# Patient Record
Sex: Female | Born: 1990 | Race: Black or African American | Hispanic: No | Marital: Single | Smoking: Current every day smoker
Health system: Southern US, Community
[De-identification: ages and names within clinical notes are randomized; demographics above are authoritative.]

## PROBLEM LIST (undated history)

## (undated) DIAGNOSIS — K219 Gastro-esophageal reflux disease without esophagitis: Secondary | ICD-10-CM

---

## 2013-05-12 ENCOUNTER — Emergency Department (HOSPITAL_COMMUNITY)
Admission: EM | Admit: 2013-05-12 | Discharge: 2013-05-12 | Disposition: A | Discharge status: Home / Self Care | Payer: PRIVATE HEALTH INSURANCE | Source: Home / Self Care | Attending: Family Medicine | Admitting: Family Medicine

## 2013-05-12 ENCOUNTER — Encounter (HOSPITAL_COMMUNITY): Payer: Self-pay | Admitting: Emergency Medicine

## 2013-05-12 DIAGNOSIS — J40 Bronchitis, not specified as acute or chronic: Secondary | ICD-10-CM

## 2013-05-12 MED ORDER — ALBUTEROL SULFATE HFA 108 (90 BASE) MCG/ACT IN AERS: 2.0000 | INHALATION_SPRAY | Freq: Four times a day (QID) | RESPIRATORY_TRACT | Status: AC | PRN

## 2013-05-12 MED ORDER — GUAIFENESIN-CODEINE 100-10 MG/5ML PO SOLN: 5.0000 mL | Freq: Every evening | ORAL | Status: DC | PRN

## 2013-05-12 MED ORDER — IPRATROPIUM-ALBUTEROL 0.5-2.5 (3) MG/3ML IN SOLN
3.0000 mL | Freq: Once | RESPIRATORY_TRACT | Status: AC
  Administered 2013-05-12: 3 mL via RESPIRATORY_TRACT

## 2013-05-12 MED ORDER — PREDNISONE 10 MG PO TABS: 30.0000 mg | ORAL_TABLET | Freq: Every day | ORAL | Status: DC

## 2013-05-12 MED ORDER — IPRATROPIUM BROMIDE 0.06 % NA SOLN: 2.0000 | Freq: Four times a day (QID) | NASAL | Status: DC

## 2013-05-12 MED ORDER — IPRATROPIUM-ALBUTEROL 0.5-2.5 (3) MG/3ML IN SOLN
RESPIRATORY_TRACT | Status: AC
  Filled 2013-05-12: qty 3

## 2013-05-12 NOTE — ED Provider Notes (Signed)
Susan Wells is a 23 y.o. female who presents to Urgent Care today for 3 days of cough fever chills headaches and ear congestion. Patient also notes occasional body aches. She's tried over-the-counter cough and cold medications which have helped. She notes some wheezing and mild shortness of breath. She denies any nausea vomiting or diarrhea. She plays drums in the marching band at [employer]    History reviewed. No pertinent past medical history. History  Substance Use Topics  . Smoking status: Current Every Day Smoker -- 0.50 packs/day    Types: Cigarettes  . Smokeless tobacco: Not on file  . Alcohol Use: Yes   ROS as above Medications: No current facility-administered medications for this encounter.   Current Outpatient Prescriptions  Medication Sig Dispense Refill  . albuterol (PROVENTIL HFA;VENTOLIN HFA) 108 (90 BASE) MCG/ACT inhaler Inhale 2 puffs into the lungs every 6 (six) hours as needed for wheezing or shortness of breath.  1 Inhaler  2  . guaiFENesin-codeine 100-10 MG/5ML syrup Take 5 mLs by mouth at bedtime as needed for cough.  120 mL  0  . ipratropium (ATROVENT) 0.06 % nasal spray Place 2 sprays into both nostrils 4 (four) times daily.  15 mL  1  . predniSONE (DELTASONE) 10 MG tablet Take 3 tablets (30 mg total) by mouth daily.  15 tablet  0    Exam:  BP 155/78  Pulse 85  Temp(Src) 98.8 F (37.1 C) (Oral)  Resp 17  SpO2 98%  LMP 04/30/2013 Gen: Well NAD HEENT: EOMI,  MMM normal-appearing posterior pharynx. Tympanic membranes are normal appearing bilaterally Lungs: Normal work of breathing. Coarse breath sounds throughout Heart: RRR no MRG Abd: NABS, Soft. NT, ND Exts: Brisk capillary refill, warm and well perfused.   Patient was given a DuoNeb nebulizer treatment, and had significant improvement in symptoms   Assessment and Plan: 23 y.o. female with bronchitis. Plan treat with prednisone albuterol codeine containing cough medication and Atrovent  nasal spray. School note provided.  Discussed warning signs or symptoms. Please see discharge instructions. Patient expresses understanding.    Rodolph BongEvan S Hadleigh Felber, MD 05/12/13 [id number]

## 2013-05-12 NOTE — Discharge Instructions (Signed)
Thank you for coming in today. Take prednisone daily for 5 days. Use codeine containing cough medication at bedtime as needed. Use Atrovent nasal spray for runny nose. Use albuterol inhaler as needed. Call or go to the emergency room if you get worse, have trouble breathing, have chest pains, or palpitations.    Bronchitis Bronchitis is inflammation of the airways that extend from the windpipe into the lungs (bronchi). The inflammation often causes mucus to develop, which leads to a cough. If the inflammation becomes severe, it may cause shortness of breath. CAUSES  Bronchitis may be caused by:   Viral infections.   Bacteria.   Cigarette smoke.   Allergens, pollutants, and other irritants.  SIGNS AND SYMPTOMS  The most common symptom of bronchitis is a frequent cough that produces mucus. Other symptoms include:  Fever.   Body aches.   Chest congestion.   Chills.   Shortness of breath.   Sore throat.  DIAGNOSIS  Bronchitis is usually diagnosed through a medical history and physical exam. Tests, such as chest X-rays, are sometimes done to rule out other conditions.  TREATMENT  You may need to avoid contact with whatever caused the problem (smoking, for example). Medicines are sometimes needed. These may include:  Antibiotics. These may be prescribed if the condition is caused by bacteria.  Cough suppressants. These may be prescribed for relief of cough symptoms.   Inhaled medicines. These may be prescribed to help open your airways and make it easier for you to breathe.   Steroid medicines. These may be prescribed for those with recurrent (chronic) bronchitis. HOME CARE INSTRUCTIONS  Get plenty of rest.   Drink enough fluids to keep your urine clear or pale yellow (unless you have a medical condition that requires fluid restriction). Increasing fluids may help thin your secretions and will prevent dehydration.   Only take over-the-counter or  prescription medicines as directed by your health care provider.  Only take antibiotics as directed. Make sure you finish them even if you start to feel better.  Avoid secondhand smoke, irritating chemicals, and strong fumes. These will make bronchitis worse. If you are a smoker, quit smoking. Consider using nicotine gum or skin patches to help control withdrawal symptoms. Quitting smoking will help your lungs heal faster.   Put a cool-mist humidifier in your bedroom at night to moisten the air. This may help loosen mucus. Change the water in the humidifier daily. You can also run the hot water in your shower and sit in the bathroom with the door closed for 5 10 minutes.   Follow up with your health care provider as directed.   Wash your hands frequently to avoid catching bronchitis again or spreading an infection to others.  SEEK MEDICAL CARE IF: Your symptoms do not improve after 1 week of treatment.  SEEK IMMEDIATE MEDICAL CARE IF:  Your fever increases.  You have chills.   You have chest pain.   You have worsening shortness of breath.   You have bloody sputum.  You faint.  You have lightheadedness.  You have a severe headache.   You vomit repeatedly. MAKE SURE YOU:   Understand these instructions.  Will watch your condition.  Will get help right away if you are not doing well or get worse. Document Released: 04/08/2005 Document Revised: 01/27/2013 Document Reviewed: 12/01/2012 [facility] Patient Information 2014 [place], [place].

## 2013-05-12 NOTE — ED Notes (Signed)
Report she feels "a little bit better" after breathing tx.

## 2013-05-12 NOTE — ED Notes (Signed)
Pt c/o cold sxs onset Monday w/sxs that include: cough, wheezing, fevers, BA Denies: v/d... Taking dayquil/nyquil, hot tea w/no signs of acute distress Alert w/no signs of acute distress.

## 2013-05-28 ENCOUNTER — Emergency Department (HOSPITAL_COMMUNITY)
Admission: EM | Admit: 2013-05-28 | Discharge: 2013-05-28 | Disposition: A | Discharge status: Home / Self Care | Payer: No Typology Code available for payment source | Attending: Emergency Medicine | Admitting: Emergency Medicine

## 2013-05-28 DIAGNOSIS — F17200 Nicotine dependence, unspecified, uncomplicated: Secondary | ICD-10-CM | POA: Insufficient documentation

## 2013-05-28 DIAGNOSIS — IMO0002 Reserved for concepts with insufficient information to code with codable children: Secondary | ICD-10-CM | POA: Insufficient documentation

## 2013-05-28 DIAGNOSIS — S39012A Strain of muscle, fascia and tendon of lower back, initial encounter: Secondary | ICD-10-CM

## 2013-05-28 DIAGNOSIS — S0993XA Unspecified injury of face, initial encounter: Secondary | ICD-10-CM | POA: Insufficient documentation

## 2013-05-28 DIAGNOSIS — Z79899 Other long term (current) drug therapy: Secondary | ICD-10-CM | POA: Insufficient documentation

## 2013-05-28 DIAGNOSIS — S335XXA Sprain of ligaments of lumbar spine, initial encounter: Secondary | ICD-10-CM | POA: Insufficient documentation

## 2013-05-28 DIAGNOSIS — S199XXA Unspecified injury of neck, initial encounter: Secondary | ICD-10-CM

## 2013-05-28 DIAGNOSIS — Y9389 Activity, other specified: Secondary | ICD-10-CM | POA: Insufficient documentation

## 2013-05-28 DIAGNOSIS — Y92410 Unspecified street and highway as the place of occurrence of the external cause: Secondary | ICD-10-CM | POA: Insufficient documentation

## 2013-05-28 DIAGNOSIS — S0990XA Unspecified injury of head, initial encounter: Secondary | ICD-10-CM | POA: Insufficient documentation
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
## 2013-05-28 NOTE — ED Provider Notes (Signed)
Medical screening examination/treatment/procedure(s) were performed by non-physician practitioner and as supervising physician I was immediately available for consultation/collaboration.  EKG Interpretation   None         Kristen N Ward, DO 05/28/13 1454 
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
## 2014-06-30 ENCOUNTER — Encounter (HOSPITAL_COMMUNITY): Payer: Self-pay | Admitting: Emergency Medicine

## 2014-06-30 ENCOUNTER — Emergency Department (INDEPENDENT_AMBULATORY_CARE_PROVIDER_SITE_OTHER)
Admission: EM | Admit: 2014-06-30 | Discharge: 2014-06-30 | Disposition: A | Discharge status: Home / Self Care | Payer: Self-pay | Source: Home / Self Care | Attending: Family Medicine | Admitting: Family Medicine

## 2014-06-30 DIAGNOSIS — J069 Acute upper respiratory infection, unspecified: Secondary | ICD-10-CM

## 2014-06-30 DIAGNOSIS — B9789 Other viral agents as the cause of diseases classified elsewhere: Secondary | ICD-10-CM

## 2014-06-30 DIAGNOSIS — J040 Acute laryngitis: Secondary | ICD-10-CM

## 2014-06-30 MED ORDER — PREDNISONE 10 MG PO TABS: 30.0000 mg | ORAL_TABLET | Freq: Every day | ORAL | Status: AC

## 2014-06-30 MED ORDER — IPRATROPIUM BROMIDE 0.06 % NA SOLN: 2.0000 | Freq: Four times a day (QID) | NASAL | Status: AC

## 2014-06-30 MED ORDER — TRAMADOL HCL 50 MG PO TABS: 50.0000 mg | ORAL_TABLET | Freq: Every evening | ORAL | Status: AC | PRN

## 2014-06-30 NOTE — ED Notes (Signed)
Pt states that she has had a cough and congestion since Tuesday evening.

## 2014-06-30 NOTE — Discharge Instructions (Signed)
Thank you for coming in today. Call or go to the emergency room if you get worse, have trouble breathing, have chest pains, or palpitations.  Do not drive after taking tramadol.     Cough, Adult  A cough is a reflex that helps clear your throat and airways. It can help heal the body or may be a reaction to an irritated airway. A cough may only last 2 or 3 weeks (acute) or may last more than 8 weeks (chronic).  CAUSES Acute cough:  Viral or bacterial infections. Chronic cough:  Infections.  Allergies.  Asthma.  Post-nasal drip.  Smoking.  Heartburn or acid reflux.  Some medicines.  Chronic lung problems (COPD).  Cancer. SYMPTOMS   Cough.  Fever.  Chest pain.  Increased breathing rate.  High-pitched whistling sound when breathing (wheezing).  Colored mucus that you cough up (sputum). TREATMENT   A bacterial cough may be treated with antibiotic medicine.  A viral cough must run its course and will not respond to antibiotics.  Your caregiver may recommend other treatments if you have a chronic cough. HOME CARE INSTRUCTIONS   Only take over-the-counter or prescription medicines for pain, discomfort, or fever as directed by your caregiver. Use cough suppressants only as directed by your caregiver.  Use a cold steam vaporizer or humidifier in your bedroom or home to help loosen secretions.  Sleep in a semi-upright position if your cough is worse at night.  Rest as needed.  Stop smoking if you smoke. SEEK IMMEDIATE MEDICAL CARE IF:   You have pus in your sputum.  Your cough starts to worsen.  You cannot control your cough with suppressants and are losing sleep.  You begin coughing up blood.  You have difficulty breathing.  You develop pain which is getting worse or is uncontrolled with medicine.  You have a fever. MAKE SURE YOU:   Understand these instructions.  Will watch your condition.  Will get help right away if you are not doing well or  get worse. Document Released: 10/05/2010 Document Revised: 07/01/2011 Document Reviewed: 10/05/2010 [facility] Patient Information 2015 [place], [place]. This information is not intended to replace advice given to you by your health care provider. Make sure you discuss any questions you have with your health care provider.  Laryngitis At the top of your windpipe is your voice box. It is the source of your voice. Inside your voice box are 2 bands of muscles called vocal cords. When you breathe, your vocal cords are relaxed and open so that air can get into the lungs. When you decide to say something, these cords come together and vibrate. The sound from these vibrations goes into your throat and comes out through your mouth as sound. Laryngitis is an inflammation of the vocal cords that causes hoarseness, cough, loss of voice, sore throat, and dry throat. Laryngitis can be temporary (acute) or long-term (chronic). Most cases of acute laryngitis improve with time.Chronic laryngitis lasts for more than 3 weeks. CAUSES Laryngitis can often be related to excessive smoking, talking, or yelling, as well as inhalation of toxic fumes and allergies. Acute laryngitis is usually caused by a viral infection, vocal strain, measles or mumps, or bacterial infections. Chronic laryngitis is usually caused by vocal cord strain, vocal cord injury, postnasal drip, growths on the vocal cords, or acid reflux. SYMPTOMS   Cough.  Sore throat.  Dry throat. RISK FACTORS  Respiratory infections.  Exposure to irritating substances, such as cigarette smoke, excessive amounts of alcohol,  stomach acids, and workplace chemicals.  Voice trauma, such as vocal cord injury from shouting or speaking too loud. DIAGNOSIS  Your cargiver will perform a physical exam. During the physical exam, your caregiver will examine your throat. The most common sign of laryngitis is hoarseness. Laryngoscopy may be necessary to confirm the diagnosis  of this condition. This procedure allows your caregiver to look into the larynx. HOME CARE INSTRUCTIONS  Drink enough fluids to keep your urine clear or pale yellow.  Rest until you no longer have symptoms or as directed by your caregiver.  Breathe in moist air.  Take all medicine as directed by your caregiver.  Do not smoke.  Talk as little as possible (this includes whispering).  Write on paper instead of talking until your voice is back to normal.  Follow up with your caregiver if your condition has not improved after 10 days. SEEK MEDICAL CARE IF:   You have trouble breathing.  You cough up blood.  You have persistent fever.  You have increasing pain.  You have difficulty swallowing. MAKE SURE YOU:  Understand these instructions.  Will watch your condition.  Will get help right away if you are not doing well or get worse. Document Released: 04/08/2005 Document Revised: 07/01/2011 Document Reviewed: 06/14/2010 [facility] Patient Information 2015 [place], [place]. This information is not intended to replace advice given to you by your health care provider. Make sure you discuss any questions you have with your health care provider.

## 2014-06-30 NOTE — ED Provider Notes (Signed)
Susan Wells is a 24 y.o. female who presents to Urgent Care today for cough congestion and hoarse voice headache chills body aches present for 2 days. One episode of vomiting. No diarrhea chest pain palpitations or abdominal pain. Patient has tried some over-the-counter medications which help. She feels well otherwise.   History reviewed. No pertinent past medical history. History reviewed. No pertinent past surgical history. History  Substance Use Topics  . Smoking status: Current Every Day Smoker -- 0.50 packs/day    Types: Cigarettes  . Smokeless tobacco: Not on file  . Alcohol Use: Yes   ROS as above Medications: No current facility-administered medications for this encounter.   Current Outpatient Prescriptions  Medication Sig Dispense Refill  . albuterol (PROVENTIL HFA;VENTOLIN HFA) 108 (90 BASE) MCG/ACT inhaler Inhale 2 puffs into the lungs every 6 (six) hours as needed for wheezing or shortness of breath. 1 Inhaler 2  . ipratropium (ATROVENT) 0.06 % nasal spray Place 2 sprays into both nostrils 4 (four) times daily. 15 mL 1  . predniSONE (DELTASONE) 10 MG tablet Take 3 tablets (30 mg total) by mouth daily. 15 tablet 0  . traMADol (ULTRAM) 50 MG tablet Take 1 tablet (50 mg total) by mouth at bedtime as needed (cough). 10 tablet 0   No Known Allergies   Exam:  BP 106/70 mmHg  Pulse 74  Temp(Src) 99.3 F (37.4 C) (Oral)  Resp 16  SpO2 98%  LMP 06/27/2014 Gen: Well NAD HEENT: EOMI,  MMM clear nasal discharge. Normal tympanic membranes bilaterally. Mild cervical lymphadenopathy present. Posterior pharynx with cobblestoning. Lungs: Normal work of breathing. CTABL mild hoarse voice Heart: RRR no MRG Abd: NABS, Soft. Nondistended, Nontender Exts: Brisk capillary refill, warm and well perfused.   No results found for this or any previous visit (from the past 24 hour(s)). No results found.  Assessment and Plan: 24 y.o. female with laryngitis and viral URI. Treat with  prednisone and Atrovent nasal spray and tramadol for cough. Work note provided.  Discussed warning signs or symptoms. Please see discharge instructions. Patient expresses understanding.     Rodolph BongEvan S Meko Masterson, MD 06/30/14 [phone]

## 2017-01-20 ENCOUNTER — Encounter (HOSPITAL_COMMUNITY): Payer: Self-pay

## 2017-01-20 ENCOUNTER — Emergency Department (HOSPITAL_COMMUNITY): Payer: Self-pay

## 2017-01-20 DIAGNOSIS — R1013 Epigastric pain: Secondary | ICD-10-CM | POA: Insufficient documentation

## 2017-01-20 DIAGNOSIS — Z79899 Other long term (current) drug therapy: Secondary | ICD-10-CM | POA: Insufficient documentation

## 2017-01-20 DIAGNOSIS — R079 Chest pain, unspecified: Secondary | ICD-10-CM | POA: Insufficient documentation

## 2017-01-20 DIAGNOSIS — F17210 Nicotine dependence, cigarettes, uncomplicated: Secondary | ICD-10-CM | POA: Insufficient documentation

## 2017-01-20 LAB — I-STAT TROPONIN, ED
TROPONIN I, POC: 0 ng/mL (ref 0.00–0.08)
Troponin i, poc: 0 ng/mL (ref 0.00–0.08)

## 2017-01-20 LAB — CBC
HEMATOCRIT: 39 % (ref 36.0–46.0)
HEMOGLOBIN: 12.9 g/dL (ref 12.0–15.0)
MCH: 27.6 pg (ref 26.0–34.0)
MCHC: 33.1 g/dL (ref 30.0–36.0)
MCV: 83.5 fL (ref 78.0–100.0)
Platelets: 316 10*3/uL (ref 150–400)
RBC: 4.67 MIL/uL (ref 3.87–5.11)
RDW: 14.5 % (ref 11.5–15.5)
WBC: 10.4 10*3/uL (ref 4.0–10.5)

## 2017-01-20 LAB — BASIC METABOLIC PANEL
ANION GAP: 6 (ref 5–15)
BUN: 7 mg/dL (ref 6–20)
CALCIUM: 9 mg/dL (ref 8.9–10.3)
CO2: 24 mmol/L (ref 22–32)
Chloride: 104 mmol/L (ref 101–111)
Creatinine, Ser: 0.67 mg/dL (ref 0.44–1.00)
GFR calc non Af Amer: >60 mL/min (ref >60)
Glucose, Bld: 85 mg/dL (ref 65–99)
POTASSIUM: 3.9 mmol/L (ref 3.5–5.1)
Sodium: 134 mmol/L — ABNORMAL LOW (ref 135–145)

## 2017-01-20 NOTE — ED Triage Notes (Signed)
Pt states that L sided CP started today, with radiation to back shoulder, denies other cardiac symptoms, hx of GERD

## 2017-01-21 ENCOUNTER — Emergency Department (HOSPITAL_COMMUNITY)
Admission: EM | Admit: 2017-01-21 | Discharge: 2017-01-21 | Disposition: A | Discharge status: Home / Self Care | Payer: Self-pay | Attending: Emergency Medicine | Admitting: Emergency Medicine

## 2017-01-21 DIAGNOSIS — R1013 Epigastric pain: Secondary | ICD-10-CM

## 2017-01-21 DIAGNOSIS — R079 Chest pain, unspecified: Secondary | ICD-10-CM

## 2017-01-21 HISTORY — DX: Gastro-esophageal reflux disease without esophagitis: K21.9

## 2017-01-21 MED ORDER — GI COCKTAIL ~~LOC~~
30.0000 mL | Freq: Once | ORAL | Status: AC
  Administered 2017-01-21: 30 mL via ORAL
  Filled 2017-01-21: qty 30

## 2017-01-21 NOTE — Discharge Instructions (Signed)
Take Prilosec as discussed - twice daily for the next 3 days, then once daily after that. Return to the emergency department with any new or concerning symptoms.

## 2017-01-24 NOTE — ED Provider Notes (Signed)
[facility]-EMERGENCY DEPT Provider Note   CSN: [id number] Arrival date & time: 01/20/17  2016     History   Chief Complaint Chief Complaint  Patient presents with  . Chest Pain    HPI Susan Wells is a 26 y.o. female.  Patient with history of GERD presents for evaluation of left chest pain that goes to the back and shoulder since earlier today. This evening she started having aching in her left arm. No SOB, diaphoresis. No modifying factors. She has not tried taking anything for symptoms. The pain is constant.   The history is provided by the patient. No language interpreter was used.    Past Medical History:  Diagnosis Date  . GERD (gastroesophageal reflux disease)     There are no active problems to display for this patient.   History reviewed. No pertinent surgical history.  OB History    No data available       Home Medications    Prior to Admission medications   Medication Sig Start Date End Date Taking? Authorizing Provider  albuterol (PROVENTIL HFA;VENTOLIN HFA) 108 (90 BASE) MCG/ACT inhaler Inhale 2 puffs into the lungs every 6 (six) hours as needed for wheezing or shortness of breath. 05/12/13   Rodolph Bong, MD  ipratropium (ATROVENT) 0.06 % nasal spray Place 2 sprays into both nostrils 4 (four) times daily. 06/30/14   Rodolph Bong, MD  predniSONE (DELTASONE) 10 MG tablet Take 3 tablets (30 mg total) by mouth daily. 06/30/14   Rodolph Bong, MD  traMADol (ULTRAM) 50 MG tablet Take 1 tablet (50 mg total) by mouth at bedtime as needed (cough). 06/30/14   Rodolph Bong, MD    Family History No family history on file.  Social History Social History  Substance Use Topics  . Smoking status: Current Every Day Smoker    Packs/day: 0.50    Types: Cigarettes  . Smokeless tobacco: Never Used  . Alcohol use Yes     Allergies   Patient has no known allergies.   Review of Systems Review of Systems  Constitutional: Negative for chills, diaphoresis and  fever.  HENT: Negative.   Respiratory: Negative.  Negative for cough and shortness of breath.   Cardiovascular: Positive for chest pain. Negative for palpitations.  Gastrointestinal: Negative.  Negative for abdominal pain and nausea.  Musculoskeletal:       Chest pain through to posterior shoulder and left arm.   Skin: Negative.   Neurological: Negative.  Negative for weakness and numbness.     Physical Exam Updated Vital Signs BP 101/81   Pulse 68   Temp 99 F (37.2 C) (Oral)   Resp 18   Ht 5' 7.5" (1.715 m)   Wt 99.8 kg (220 lb)   LMP 01/19/2017   SpO2 99%   BMI 33.95 kg/m   Physical Exam  Constitutional: She is oriented to person, place, and time. She appears well-developed and well-nourished.  HENT:  Head: Normocephalic.  Neck: Normal range of motion. Neck supple.  Cardiovascular: Normal rate and regular rhythm.   No murmur heard. Pulmonary/Chest: Effort normal and breath sounds normal. She has no wheezes. She has no rales. She exhibits no tenderness.  Abdominal: Soft. Bowel sounds are normal. There is no tenderness. There is no rebound and no guarding.  Musculoskeletal: Normal range of motion.  Neurological: She is alert and oriented to person, place, and time.  Skin: Skin is warm and dry. No rash noted.  Psychiatric: She has a  normal mood and affect.     ED Treatments / Results  Labs (all labs ordered are listed, but only abnormal results are displayed) Labs Reviewed  BASIC METABOLIC PANEL - Abnormal; Notable for the following:       Result Value   Sodium 134 (*)    All other components within normal limits  CBC  I-STAT TROPONIN, ED  I-STAT TROPONIN, ED   Results for orders placed or performed during the hospital encounter of 01/21/17  Basic metabolic panel  Result Value Ref Range   Sodium 134 (L) 135 - 145 mmol/L   Potassium 3.9 3.5 - 5.1 mmol/L   Chloride 104 101 - 111 mmol/L   CO2 24 22 - 32 mmol/L   Glucose, Bld 85 65 - 99 mg/dL   BUN 7 6 - 20  mg/dL   Creatinine, Ser 1.61 0.44 - 1.00 mg/dL   Calcium 9.0 8.9 - 09.6 mg/dL   GFR calc non Af Amer >60 >60 mL/min   GFR calc Af Amer >60 >60 mL/min   Anion gap 6 5 - 15  CBC  Result Value Ref Range   WBC 10.4 4.0 - 10.5 K/uL   RBC 4.67 3.87 - 5.11 MIL/uL   Hemoglobin 12.9 12.0 - 15.0 g/dL   HCT 04.5 40.9 - 81.1 %   MCV 83.5 78.0 - 100.0 fL   MCH 27.6 26.0 - 34.0 pg   MCHC 33.1 30.0 - 36.0 g/dL   RDW 91.4 78.2 - 95.6 %   Platelets 316 150 - 400 K/uL  I-stat troponin, ED  Result Value Ref Range   Troponin i, poc 0.00 0.00 - 0.08 ng/mL   Comment 3          I-stat troponin, ED  Result Value Ref Range   Troponin i, poc 0.00 0.00 - 0.08 ng/mL   Comment 3            EKG  EKG Interpretation  Date/Time:  Monday January 20 2017 20:24:26 EDT Ventricular Rate:  83 PR Interval:  152 QRS Duration: 86 QT Interval:  370 QTC Calculation: 434 R Axis:   66 Text Interpretation:  Normal sinus rhythm with sinus arrhythmia Normal ECG Confirmed by Rolland Porter ([id number]) on 01/21/2017 8:00:51 PM       Radiology No results found. Dg Chest 2 View  Result Date: 01/20/2017 CLINICAL DATA:  Left-sided chest pain EXAM: CHEST  2 VIEW COMPARISON:  None. FINDINGS: The heart size and mediastinal contours are within normal limits. Both lungs are clear. The visualized skeletal structures are unremarkable. IMPRESSION: No active cardiopulmonary disease. Electronically Signed   By: Deatra Robinson M.D.   On: 01/20/2017 21:22   Procedures Procedures (including critical care time)  Medications Ordered in ED Medications  gi cocktail (Maalox,Lidocaine,Donnatal) (30 mLs Oral Given 01/21/17 0049)     Initial Impression / Assessment and Plan / ED Course  I have reviewed the triage vital signs and the nursing notes.  Pertinent labs & imaging results that were available during my care of the patient were reviewed by me and considered in my medical decision making (see chart for details).     Patient with a  history of GERD presents with chest pain, constant, through to back x 1 day.   Labs, including delta troponin, are negative. EKG has no evidence of ischemic change. CXR clear. GI cocktail provided on suspicion of reflux as cause of symptoms and pain is improved.   She is considered stable for discharge  home. PCP follow up encouraged.   Final Clinical Impressions(s) / ED Diagnoses   Final diagnoses:  Dyspepsia  Nonspecific chest pain    New Prescriptions Discharge Medication List as of 01/21/2017  1:21 AM       Elpidio Anis, PA-C 01/27/17 2345    Derwood Kaplan, MD 01/28/17 Ernestina Columbia

## 2019-07-30 ENCOUNTER — Emergency Department (HOSPITAL_COMMUNITY)
Admission: EM | Admit: 2019-07-30 | Discharge: 2019-07-30 | Disposition: A | Discharge status: Home / Self Care | Payer: BC Managed Care – PPO | Attending: Emergency Medicine | Admitting: Emergency Medicine

## 2019-07-30 ENCOUNTER — Other Ambulatory Visit: Payer: Self-pay

## 2019-07-30 ENCOUNTER — Emergency Department (HOSPITAL_COMMUNITY): Payer: BC Managed Care – PPO

## 2019-07-30 ENCOUNTER — Encounter (HOSPITAL_COMMUNITY): Payer: Self-pay | Admitting: *Deleted

## 2019-07-30 DIAGNOSIS — Z5321 Procedure and treatment not carried out due to patient leaving prior to being seen by health care provider: Secondary | ICD-10-CM | POA: Insufficient documentation

## 2019-07-30 DIAGNOSIS — R079 Chest pain, unspecified: Secondary | ICD-10-CM | POA: Insufficient documentation

## 2019-07-30 MED ORDER — SODIUM CHLORIDE 0.9% FLUSH: 3.0000 mL | Freq: Once | INTRAVENOUS | Status: DC

## 2019-07-30 NOTE — ED Notes (Signed)
Called patient to recollect vitals and no one responded

## 2019-07-30 NOTE — ED Triage Notes (Signed)
States chest pain started yesterday, has GERD, antiacids helped yesterday but symptom,s returned today

## 2020-01-14 ENCOUNTER — Emergency Department (HOSPITAL_COMMUNITY): Payer: Self-pay

## 2020-01-14 ENCOUNTER — Encounter (HOSPITAL_COMMUNITY): Payer: Self-pay | Admitting: Emergency Medicine

## 2020-01-14 ENCOUNTER — Emergency Department (HOSPITAL_COMMUNITY)
Admission: EM | Admit: 2020-01-14 | Discharge: 2020-01-15 | Disposition: A | Discharge status: Home / Self Care | Payer: Self-pay | Attending: Emergency Medicine | Admitting: Emergency Medicine

## 2020-01-14 ENCOUNTER — Other Ambulatory Visit: Payer: Self-pay

## 2020-01-14 DIAGNOSIS — R0602 Shortness of breath: Secondary | ICD-10-CM | POA: Insufficient documentation

## 2020-01-14 DIAGNOSIS — Z7951 Long term (current) use of inhaled steroids: Secondary | ICD-10-CM | POA: Insufficient documentation

## 2020-01-14 DIAGNOSIS — J45909 Unspecified asthma, uncomplicated: Secondary | ICD-10-CM | POA: Insufficient documentation

## 2020-01-14 DIAGNOSIS — F17210 Nicotine dependence, cigarettes, uncomplicated: Secondary | ICD-10-CM | POA: Insufficient documentation

## 2020-01-14 NOTE — ED Triage Notes (Signed)
Patient states that she has seasonal asthma when she was young. Patient is complaining of sob that started around 2030. Patient states she was sitting outside when this started.

## 2020-01-15 MED ORDER — ALBUTEROL SULFATE HFA 108 (90 BASE) MCG/ACT IN AERS
8.0000 | INHALATION_SPRAY | Freq: Once | RESPIRATORY_TRACT | Status: AC
  Administered 2020-01-15: 8 via RESPIRATORY_TRACT
  Filled 2020-01-15: qty 6.7

## 2020-01-15 NOTE — ED Provider Notes (Signed)
[facility]-EMERGENCY DEPT Provider Note   CSN: [id number] Arrival date & time: 01/14/20  2243     History Chief Complaint  Patient presents with  . Shortness of Breath    Susan Wells is a 29 y.o. female.  The history is provided by the patient and medical records.  Shortness of Breath  30 year old female presenting to the ED with shortness of breath.  States she was sitting outside with her dog at the dog park tonight when this began.  States all of a sudden she felt a tightness in her chest and her throat.  She reports history of seasonal asthma associated with allergies but has not had any issues with this over the past several years.  States recently she has noticed some increased sneezing, scratchy throat, etc.  She does report voice is hoarse but has been this way for several months now.  States she thought this was due to her uncontrolled acid reflux as she has been told this previously.  She is waiting to see GI doctor about this.  She denies any fever, chills, sweats, or cough.  No sick contacts.  She has been vaccinated for Covid.  Past Medical History:  Diagnosis Date  . GERD (gastroesophageal reflux disease)     There are no problems to display for this patient.   History reviewed. No pertinent surgical history.   OB History   No obstetric history on file.     History reviewed. No pertinent family history.  Social History   Tobacco Use  . Smoking status: Current Every Day Smoker    Packs/day: 0.50    Types: Cigarettes  . Smokeless tobacco: Never Used  Substance Use Topics  . Alcohol use: Yes  . Drug use: No    Home Medications Prior to Admission medications   Medication Sig Start Date End Date Taking? Authorizing Provider  albuterol (PROVENTIL HFA;VENTOLIN HFA) 108 (90 BASE) MCG/ACT inhaler Inhale 2 puffs into the lungs every 6 (six) hours as needed for wheezing or shortness of breath. 05/12/13   Rodolph Bong, MD  ipratropium  (ATROVENT) 0.06 % nasal spray Place 2 sprays into both nostrils 4 (four) times daily. 06/30/14   Rodolph Bong, MD  predniSONE (DELTASONE) 10 MG tablet Take 3 tablets (30 mg total) by mouth daily. 06/30/14   Rodolph Bong, MD  traMADol (ULTRAM) 50 MG tablet Take 1 tablet (50 mg total) by mouth at bedtime as needed (cough). 06/30/14   Rodolph Bong, MD    Allergies    Patient has no known allergies.  Review of Systems   Review of Systems  Respiratory: Positive for shortness of breath.   All other systems reviewed and are negative.   Physical Exam Updated Vital Signs BP 137/78   Pulse 95   Temp 98.7 F (37.1 C)   Resp 11   SpO2 99%   Physical Exam Vitals and nursing note reviewed.  Constitutional:      Appearance: She is well-developed.  HENT:     Head: Normocephalic and atraumatic.     Right Ear: Tympanic membrane and ear canal normal.     Left Ear: Tympanic membrane and ear canal normal.     Nose: Nose normal.     Mouth/Throat:     Comments: Voice is mildly hoarse (states chronic for months now), no tonsillar edema or exudates Eyes:     Conjunctiva/sclera: Conjunctivae normal.     Pupils: Pupils are equal, round, and reactive  to light.  Cardiovascular:     Rate and Rhythm: Normal rate and regular rhythm.     Heart sounds: Normal heart sounds.  Pulmonary:     Effort: Pulmonary effort is normal.     Breath sounds: Wheezing present. No rhonchi.     Comments: Faint end expiratory wheezes noted, no rhonchi or rales, NAD Abdominal:     General: Bowel sounds are normal.     Palpations: Abdomen is soft.  Musculoskeletal:        General: Normal range of motion.     Cervical back: Normal range of motion.  Skin:    General: Skin is warm and dry.  Neurological:     Mental Status: She is alert and oriented to person, place, and time.     ED Results / Procedures / Treatments   Labs (all labs ordered are listed, but only abnormal results are displayed) Labs Reviewed - No  data to display  EKG None  Radiology DG Chest 2 View  Result Date: 01/14/2020 CLINICAL DATA:  Shortness of breath EXAM: CHEST - 2 VIEW COMPARISON:  Radiograph 01/20/2017 FINDINGS: Mild airways thickening is noted. No consolidation, features of edema, pneumothorax, or effusion. Pulmonary vascularity is normally distributed. The cardiomediastinal contours are unremarkable. No acute osseous or soft tissue abnormality. IMPRESSION: Mild airways thickening could reflect a bronchitis or reactive airways disease. Electronically Signed   By: Kreg Shropshire M.D.   On: 01/14/2020 23:07    Procedures Procedures (including critical care time)  Medications Ordered in ED Medications  albuterol (VENTOLIN HFA) 108 (90 Base) MCG/ACT inhaler 8 puff (8 puffs Inhalation Given 01/15/20 0011)    ED Course  I have reviewed the triage vital signs and the nursing notes.  Pertinent labs & imaging results that were available during my care of the patient were reviewed by me and considered in my medical decision making (see chart for details).    MDM Rules/Calculators/A&P  29 year old female here with shortness of breath.  Began tonight while sitting outside of a dog park.  Has a history of allergic asthma in the past, no issues over the past few years.  She is afebrile and nontoxic.  She is in no acute respiratory distress, does have faint end expiratory wheezes on exam.  Chest x-ray with mild airway thickening, no focal infiltrates.  Patient has not had any fever or sick contacts.  She has been vaccinated against Covid and declined testing today when offered.  She was given albuterol inhaler here with good improvement of symptoms.  Suspect this is allergic/asthmatic in nature.  Will continue inhaler at home, re-start daily allergy medications.  Follow-up with PCP.  Return here for any new/acute changes.  Final Clinical Impression(s) / ED Diagnoses Final diagnoses:  SOB (shortness of breath)    Rx / DC Orders ED  Discharge Orders    None       Garlon Hatchet, PA-C 01/15/20 0054    Sabas Sous, MD 01/15/20 [phone]

## 2020-01-15 NOTE — Discharge Instructions (Signed)
Can continue using inhaler when needed.  2 puffs if mild, can do up to 8 at a time if needed. May want to re-start allergy medicine.  Claritin can be less sedating than zyrtec so can give that a try. Return here for any new/acute changes.

## 2021-07-29 IMAGING — CR DG CHEST 2V
2 series · 2 of 2 positions shown · non-contrast
Comparison: Radiograph 01/20/2017

CLINICAL DATA: Shortness of breath

EXAM:
CHEST - 2 VIEW

[w chest pa]
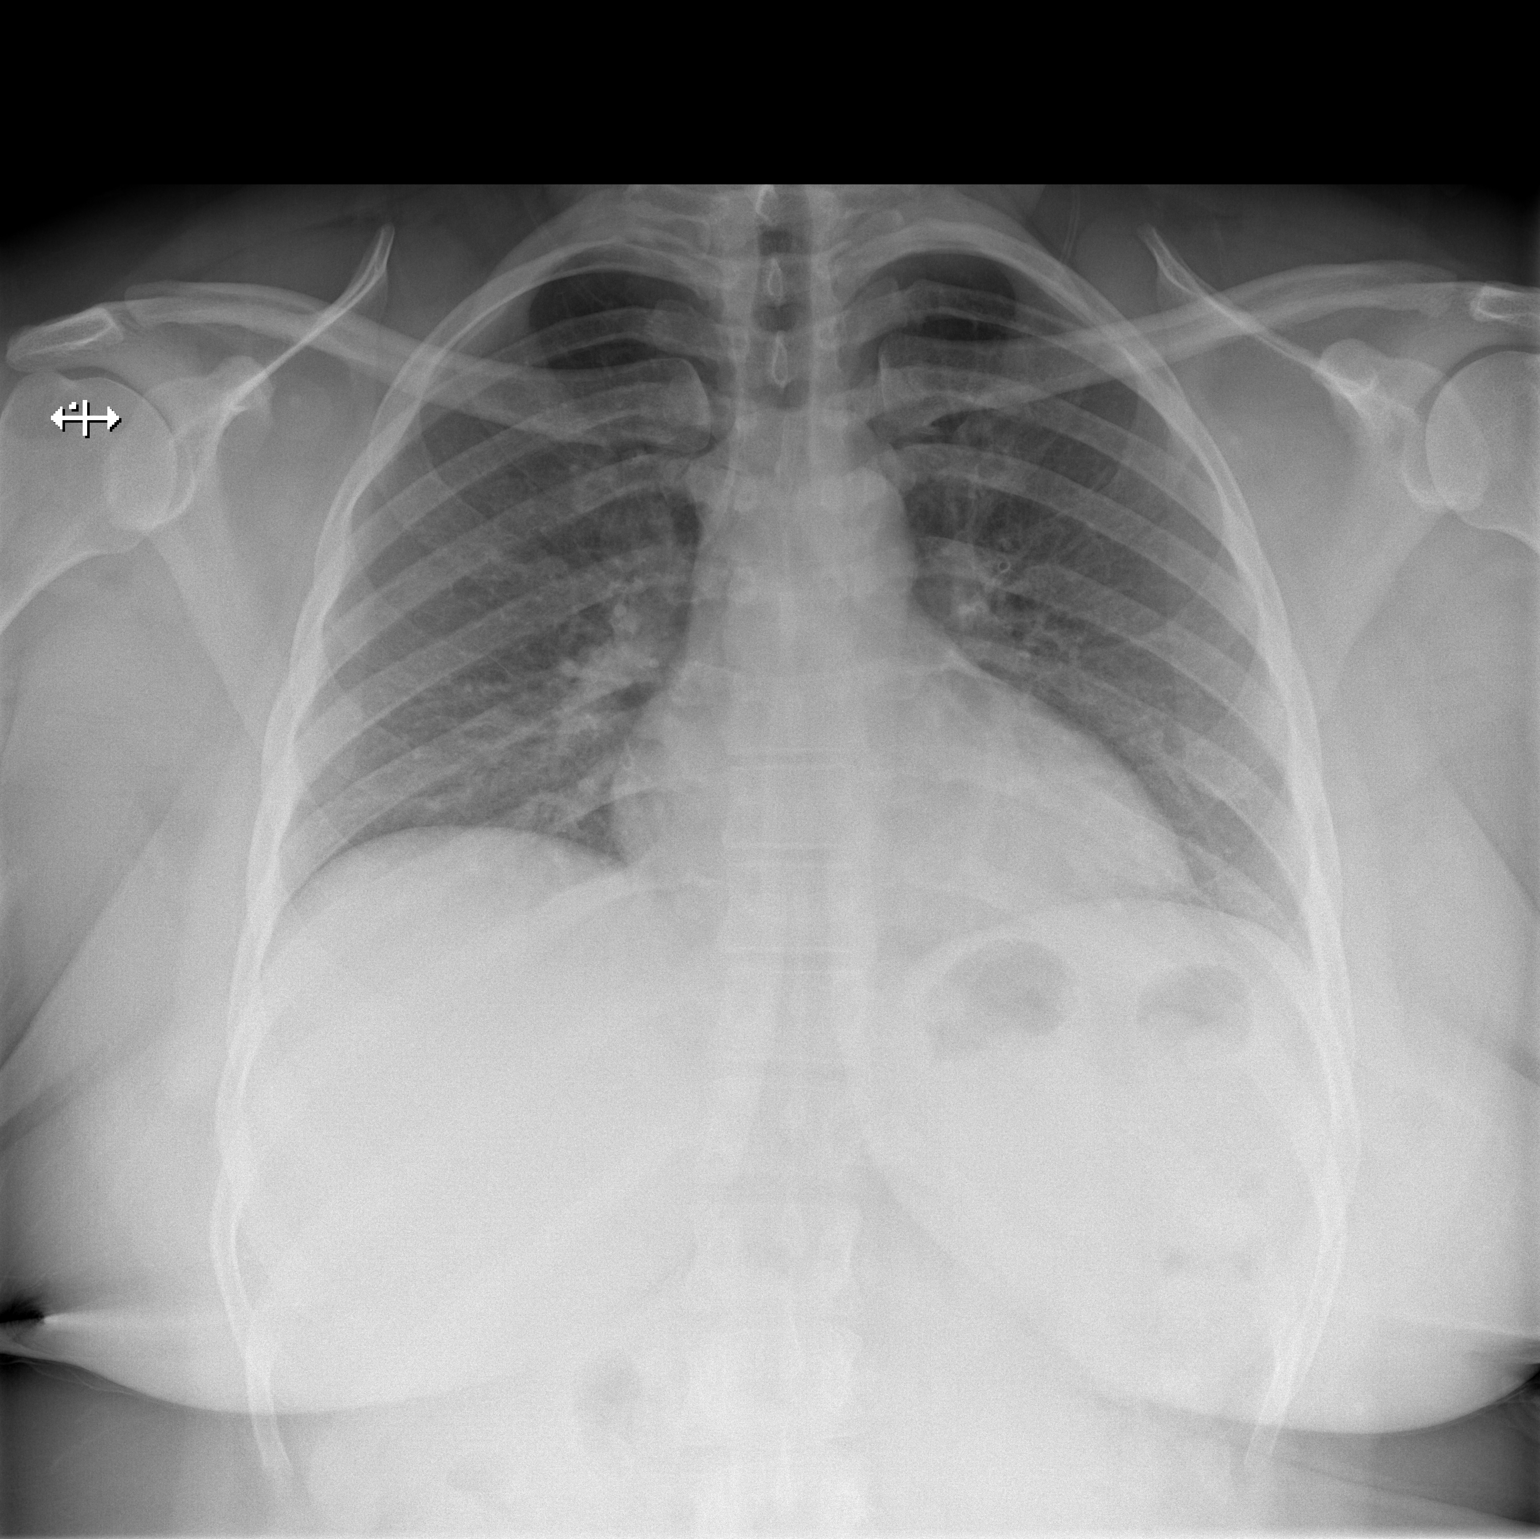

[w chest lat]
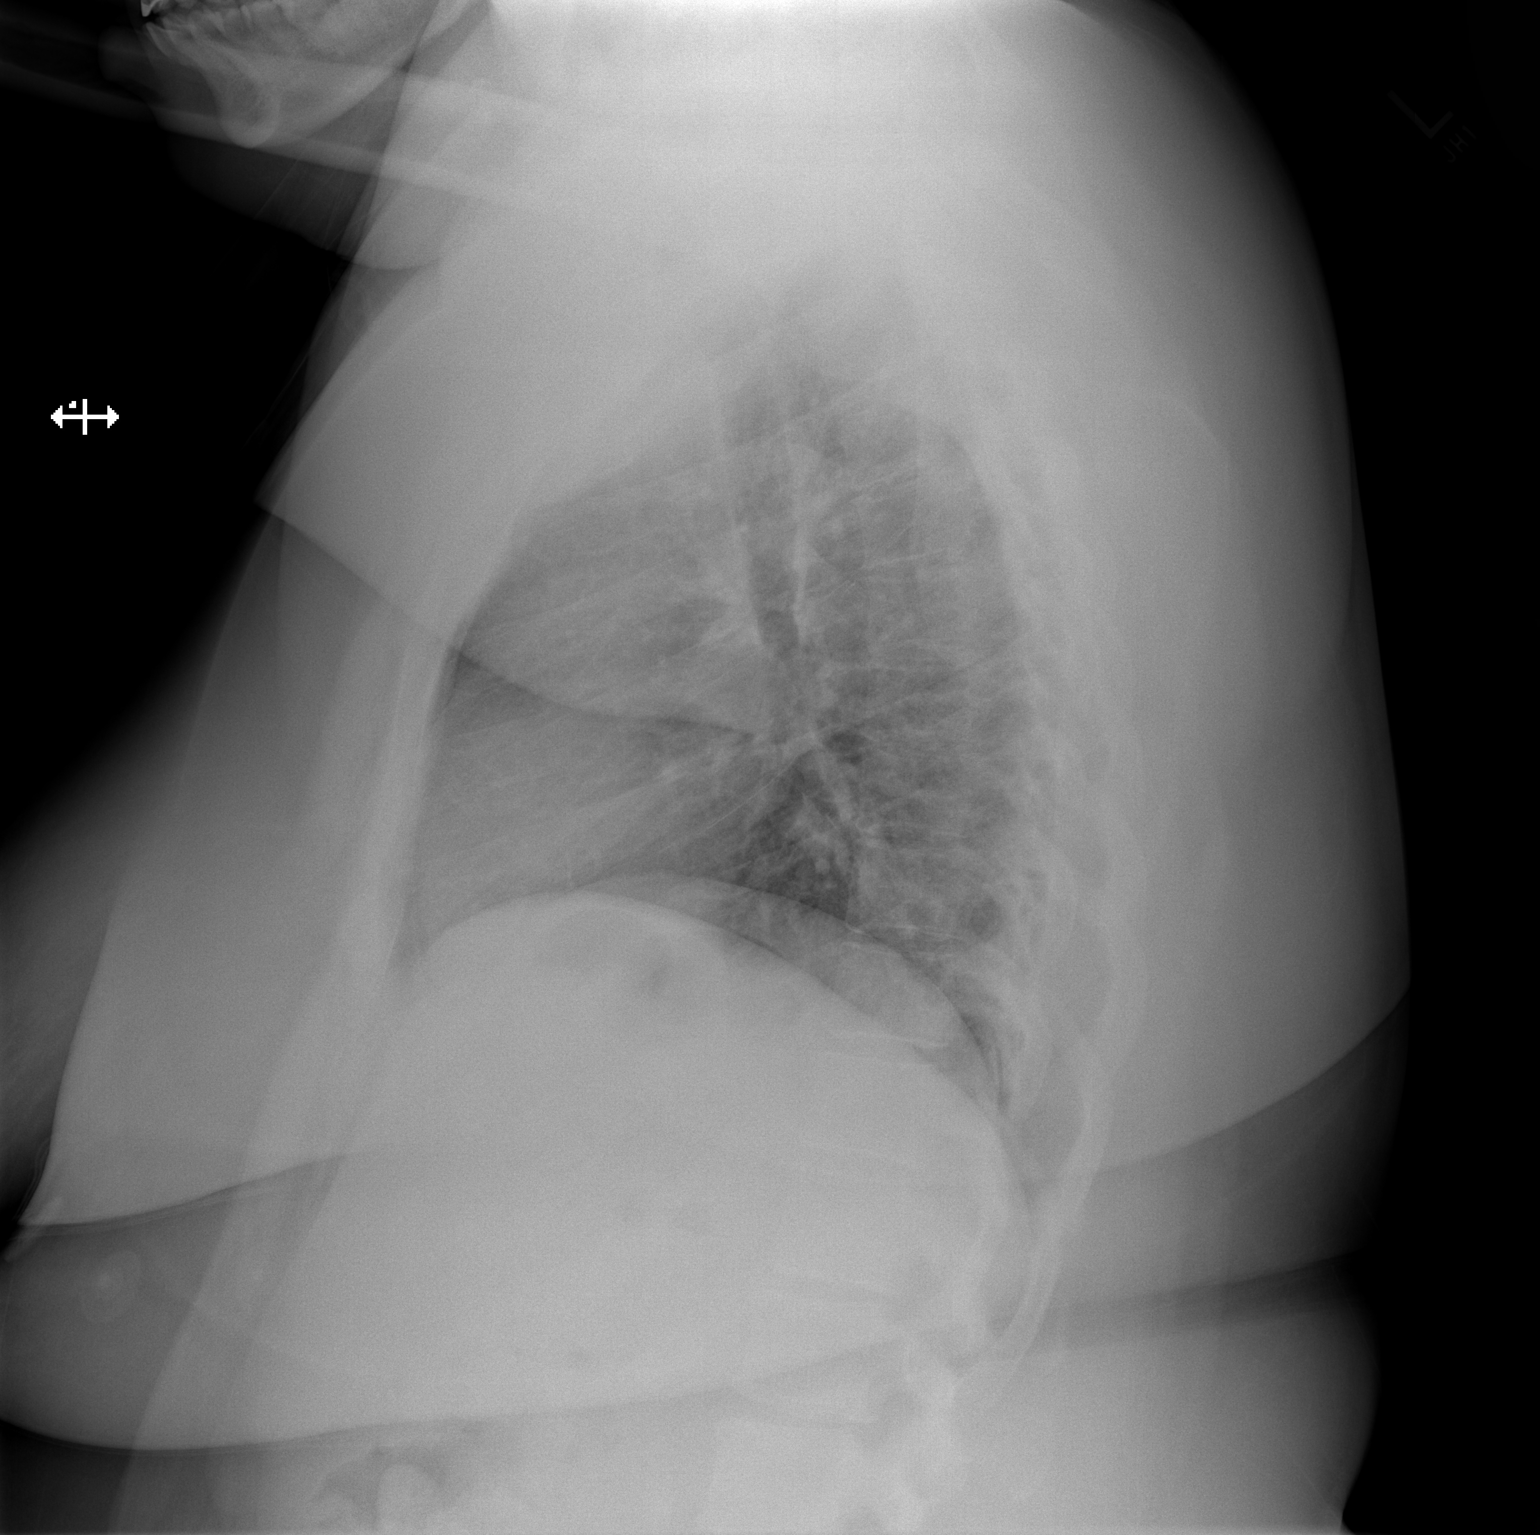

[2 of 2 positions shown; findings below may reference images not displayed]

FINDINGS: Mild airways thickening is noted. No consolidation, features of
edema, pneumothorax, or effusion. Pulmonary vascularity is normally
distributed. The cardiomediastinal contours are unremarkable. No
acute osseous or soft tissue abnormality.
IMPRESSION: Mild airways thickening could reflect a bronchitis or reactive
airways disease.
